# Patient Record
Sex: Female | Born: 1985 | Race: White | Hispanic: No | Marital: Single | Smoking: Never smoker
Health system: Southern US, Community
[De-identification: ages and names within clinical notes are randomized; demographics above are authoritative.]

---

## 2007-10-28 HISTORY — PX: WISDOM TOOTH EXTRACTION: SHX21

## 2017-02-06 ENCOUNTER — Encounter (HOSPITAL_COMMUNITY): Payer: Self-pay | Admitting: *Deleted

## 2017-02-06 ENCOUNTER — Emergency Department (HOSPITAL_COMMUNITY): Payer: BLUE CROSS/BLUE SHIELD

## 2017-02-06 ENCOUNTER — Emergency Department (HOSPITAL_COMMUNITY)
Admission: EM | Admit: 2017-02-06 | Discharge: 2017-02-07 | Disposition: A | Discharge status: Home / Self Care | Payer: BLUE CROSS/BLUE SHIELD | Attending: Physician Assistant | Admitting: Physician Assistant

## 2017-02-06 DIAGNOSIS — Y999 Unspecified external cause status: Secondary | ICD-10-CM | POA: Diagnosis not present

## 2017-02-06 DIAGNOSIS — S0990XA Unspecified injury of head, initial encounter: Secondary | ICD-10-CM | POA: Diagnosis not present

## 2017-02-06 DIAGNOSIS — Z79899 Other long term (current) drug therapy: Secondary | ICD-10-CM | POA: Diagnosis not present

## 2017-02-06 DIAGNOSIS — S60812A Abrasion of left wrist, initial encounter: Secondary | ICD-10-CM | POA: Diagnosis not present

## 2017-02-06 DIAGNOSIS — Y939 Activity, unspecified: Secondary | ICD-10-CM | POA: Insufficient documentation

## 2017-02-06 DIAGNOSIS — Z5181 Encounter for therapeutic drug level monitoring: Secondary | ICD-10-CM | POA: Insufficient documentation

## 2017-02-06 DIAGNOSIS — S0081XA Abrasion of other part of head, initial encounter: Secondary | ICD-10-CM | POA: Diagnosis not present

## 2017-02-06 DIAGNOSIS — S0993XA Unspecified injury of face, initial encounter: Secondary | ICD-10-CM | POA: Diagnosis present

## 2017-02-06 DIAGNOSIS — Z23 Encounter for immunization: Secondary | ICD-10-CM | POA: Insufficient documentation

## 2017-02-06 DIAGNOSIS — Y92410 Unspecified street and highway as the place of occurrence of the external cause: Secondary | ICD-10-CM | POA: Insufficient documentation

## 2017-02-06 LAB — COMPREHENSIVE METABOLIC PANEL
ALT: 27 U/L (ref 14–54)
AST: 34 U/L (ref 15–41)
Albumin: 4.3 g/dL (ref 3.5–5.0)
Alkaline Phosphatase: 47 U/L (ref 38–126)
Anion gap: 8 (ref 5–15)
BUN: 17 mg/dL (ref 6–20)
CO2: 23 mmol/L (ref 22–32)
CREATININE: 0.83 mg/dL (ref 0.44–1.00)
Calcium: 9.3 mg/dL (ref 8.9–10.3)
Chloride: 106 mmol/L (ref 101–111)
Glucose, Bld: 93 mg/dL (ref 65–99)
POTASSIUM: 3.7 mmol/L (ref 3.5–5.1)
Sodium: 137 mmol/L (ref 135–145)
TOTAL PROTEIN: 7.3 g/dL (ref 6.5–8.1)
Total Bilirubin: 0.5 mg/dL (ref 0.3–1.2)

## 2017-02-06 LAB — ETHANOL: Alcohol, Ethyl (B): <5 mg/dL (ref <5)

## 2017-02-06 LAB — I-STAT CG4 LACTIC ACID, ED: LACTIC ACID, VENOUS: 0.95 mmol/L (ref 0.5–1.9)

## 2017-02-06 LAB — I-STAT CHEM 8, ED
BUN: 22 mg/dL — ABNORMAL HIGH (ref 6–20)
CREATININE: 0.9 mg/dL (ref 0.44–1.00)
Calcium, Ion: 1.19 mmol/L (ref 1.15–1.40)
Chloride: 104 mmol/L (ref 101–111)
GLUCOSE: 96 mg/dL (ref 65–99)
HCT: 44 % (ref 36.0–46.0)
HEMOGLOBIN: 15 g/dL (ref 12.0–15.0)
Potassium: 3.7 mmol/L (ref 3.5–5.1)
Sodium: 139 mmol/L (ref 135–145)
TCO2: 26 mmol/L (ref 0–100)

## 2017-02-06 LAB — SAMPLE TO BLOOD BANK

## 2017-02-06 LAB — CBC
HCT: 42.8 % (ref 36.0–46.0)
Hemoglobin: 14.3 g/dL (ref 12.0–15.0)
MCH: 31.6 pg (ref 26.0–34.0)
MCHC: 33.4 g/dL (ref 30.0–36.0)
MCV: 94.5 fL (ref 78.0–100.0)
PLATELETS: 255 10*3/uL (ref 150–400)
RBC: 4.53 MIL/uL (ref 3.87–5.11)
RDW: 12.4 % (ref 11.5–15.5)
WBC: 10.3 10*3/uL (ref 4.0–10.5)

## 2017-02-06 LAB — PROTIME-INR
INR: 0.86
PROTHROMBIN TIME: 11.7 s (ref 11.4–15.2)

## 2017-02-06 LAB — I-STAT BETA HCG BLOOD, ED (MC, WL, AP ONLY)

## 2017-02-06 MED ORDER — TETANUS-DIPHTH-ACELL PERTUSSIS 5-2.5-18.5 LF-MCG/0.5 IM SUSP
0.5000 mL | Freq: Once | INTRAMUSCULAR | Status: AC
  Administered 2017-02-07: 0.5 mL via INTRAMUSCULAR
  Filled 2017-02-06: qty 0.5
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
## 2017-02-06 NOTE — ED Notes (Signed)
Pt in X ray/CT
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
## 2017-02-07 LAB — CDS SEROLOGY

## 2017-02-07 MED ORDER — CYCLOBENZAPRINE HCL 10 MG PO TABS: 10.0000 mg | ORAL_TABLET | Freq: Two times a day (BID) | ORAL | 0 refills | Status: DC | PRN

## 2017-02-07 MED ORDER — IBUPROFEN 800 MG PO TABS: 800.0000 mg | ORAL_TABLET | Freq: Three times a day (TID) | ORAL | 0 refills | Status: DC

## 2017-02-07 NOTE — Discharge Instructions (Signed)
Please use these medications to help with your symtpoms.  Return with any concerns.

## 2017-02-07 NOTE — ED Notes (Signed)
Dr Corlis Leak in room to discuss results with pt. C-collar removed by Dr Corlis Leak

## 2017-02-07 NOTE — ED Notes (Signed)
Pt verbalized understanding of d/c instructions and has no further questions. Pt stable and NAD. Pt able to ambulate without difficulty. VSS.
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
## 2019-08-22 ENCOUNTER — Other Ambulatory Visit: Payer: Self-pay

## 2019-08-22 DIAGNOSIS — Z20822 Contact with and (suspected) exposure to covid-19: Secondary | ICD-10-CM

## 2019-08-23 LAB — NOVEL CORONAVIRUS, NAA: SARS-CoV-2, NAA: NOT DETECTED

## 2022-10-27 NOTE — L&D Delivery Note (Signed)
Delivery Note  Ariana Mendez is a G2P0 at [redacted]w[redacted]d with an LMP of 11/09/2022, consistent with [place] at [redacted]w[redacted]d.   First Stage: Labor onset: on 10/17; ctx's 4-5 minutes apart for about 2 hours  Augmentation: oxytocin and AROM Analgesia /Anesthesia intrapartum: epidural AROM at 0053 GBS: negative IP Antibiotics: None  Second Stage: Complete dilation at 0713 Onset of pushing at 0742 FHR second stage 135 bpm with moderate, variable decels with pushing   [name] presented to L&D with uterine contractions every 2 to 5 minutes. She was augmented with AROM and IV Pitocin. She progressed  to C/C/+2 with no urge to push. She pushed effectively over approximately 25 minutes for a spontaneous vaginal birth.  Delivery of a viable baby boy on 08/14/2023 at 0820 by CNM Delivery of fetal head in OA position with restitution to LOT. Nuchal cord x 1 around shoulder;  Anterior then posterior shoulders delivered easily with gentle downward traction and baby somersaulted to maternal right. Baby placed on mom's chest, and attended to by baby RN. Cord double clamped after cessation of pulsation, cut by FOB  Margaretmary Eddy, CNM at bedside and supervised delivery.  Cord blood sample collection: Not Indicated A POS Performed at [facility]: [facility], [address] [place], [place] [address]  Collection of cord blood donation: None Arterial cord blood sample: None   Third Stage: Oxytocin bolus started after delivery of infant for hemorrhage prophylaxis  Placenta delivered intact with 3 VC @ 0829 Placenta disposition: Discarded  Uterine tone Firm / bleeding Minimal   Laceration identified: abrasion--> repair not indicated  Anesthesia for repair: None  Repair: None Est. Blood Loss (mL): 450  Complications: None   Mom to postpartum.  Baby to Couplet care / Skin to Skin.  Newborn: Information for the patient's newborn:  [name], [name] [[id number]]  Live born female  Birth Weight:   APGAR: 8,  9  Newborn Delivery   Birth date/time: 08/14/2023 08:20:00 Delivery type: Vaginal, Spontaneous      Feeding planned: breast feeding  ---------- Roney Jaffe CNM Certified Nurse Midwife [facility] OB[facility]

## 2023-01-01 DIAGNOSIS — O09513 Supervision of elderly primigravida, third trimester: Secondary | ICD-10-CM | POA: Insufficient documentation

## 2023-02-02 ENCOUNTER — Ambulatory Visit: Payer: Self-pay

## 2023-02-04 ENCOUNTER — Ambulatory Visit: Payer: Self-pay | Admitting: Maternal & Fetal Medicine

## 2023-02-04 ENCOUNTER — Ambulatory Visit: Payer: 59 | Attending: Maternal & Fetal Medicine

## 2023-02-04 ENCOUNTER — Other Ambulatory Visit: Payer: Self-pay

## 2023-02-04 VITALS — BP 121/91 | HR 99 | Temp 98.5°F | Ht 64.0 in | Wt 184.5 lb

## 2023-02-04 DIAGNOSIS — O09511 Supervision of elderly primigravida, first trimester: Secondary | ICD-10-CM | POA: Diagnosis not present

## 2023-02-04 DIAGNOSIS — Z3A12 12 weeks gestation of pregnancy: Secondary | ICD-10-CM

## 2023-02-04 DIAGNOSIS — Z818 Family history of other mental and behavioral disorders: Secondary | ICD-10-CM | POA: Diagnosis not present

## 2023-02-04 NOTE — Progress Notes (Signed)
Referring Provider:   [facility] Length of Consultation: 30 minutes  [name] was referred to Maternal Fetal Care at [facility] for genetic counseling because of advanced maternal age.  The patient will be 37 years old at the time of delivery.  She was present at this visit alone and was counseled by Sheppard Plumber, genetic counseling intern, supervised by Katrina Stack, MS, CGC.  We explained that the chance of a chromosome abnormality increases with maternal age.  Chromosomes and examples of chromosome problems were reviewed.  Humans typically have 46 chromosomes in each cell, with half passed through each sperm and egg.  Any change in the number or structure of chromosomes can increase the risk of problems in the physical and mental development of a pregnancy.   Based upon age of the patient and the current gestational age, the chance of any chromosome abnormality was 1 in 70. The chance of Down syndrome, the most common chromosome problem associated with maternal age, was 1 in 82.  The risk of chromosome problems is in addition to the 3% general population risk for birth defects and intellectual disabilities.  The greatest chance, of course, is that the baby would be born in good health.  We discussed the following prenatal screening and testing options for this pregnancy:  Cell free fetal DNA testing analyzes maternal blood to determine whether or not the baby may have Down syndrome, trisomy 35, or trisomy 68.  This test utilizes a maternal blood sample and DNA sequencing technology to isolate circulating cell free fetal DNA from maternal plasma.  The fetal DNA can then be analyzed for DNA sequences that are derived from the three most common chromosomes involved in aneuploidy, chromosomes 13, 18, and 21.  If the overall amount of DNA is greater than the expected level for any of these chromosomes, aneuploidy is suspected.  While we do not consider it a replacement for invasive  testing and karyotype analysis, a negative result from this testing would be reassuring, though not a guarantee of a normal chromosome complement for the baby.  An abnormal result is certainly suggestive of an abnormal chromosome complement, though we would still recommend CVS or amniocentesis to confirm any findings from this testing.  [name] had prior cell free DNA testing in this pregnancy which was negative for Trisomy 53, 59 and 21.  Results were also low risk for sex chromosome aneuploidies and revealed the fetal gender to be female. This result significantly reduces the chance for these conditions in the pregnancy, but is still considered a screening test.   The chorionic villus sampling procedure is available for first trimester chromosome analysis.  This involves the withdrawal of a small amount of chorionic villi (tissue from the developing placenta).  Risk of pregnancy loss is estimated to be approximately 1 in 200 to 1 in 100 (0.5 to 1%).  There is approximately a 1% (1 in 100) chance that the CVS chromosome results will be unclear.  Chorionic villi cannot be tested for neural tube defects.     Targeted ultrasound uses high frequency sound waves to create an image of the developing fetus.  An ultrasound is often recommended as a routine means of evaluating the pregnancy.  It is also used to screen for fetal anatomy problems (for example, a heart defect) that might be suggestive of a chromosomal or other abnormality.   Amniocentesis involves the removal of a small amount of amniotic fluid from the sac surrounding the fetus with  the use of a thin needle inserted through the maternal abdomen and uterus.  Ultrasound guidance is used throughout the procedure.  Fetal cells from amniotic fluid are directly evaluated and > 99.5% of chromosome problems and > 98% of open neural tube defects can be detected. This procedure is generally performed after the 15th week of pregnancy.  The main risks to this  procedure include complications leading to miscarriage in less than 1 in 200 cases (0.5%).  Carrier Screening: Per the ACOG Committee Opinion 691, all women who are considering a pregnancy or are currently pregnant should be offered carrier screening for, at minimum, Cystic Fibrosis (CF), Spinal Muscular Atrophy (SMA), and Hemoglobinopathies The mode of inheritance, clinical manifestations of these conditions, as well as details about testing were reviewed. A negative result on carrier screening reduces the likelihood of being a carrier, however, does not entirely rule out the possibility. If Ariana Mendez was found to be a carrier for a specific condition, carrier screening for their reproductive partner would be recommended.  We also discussed that expanded carrier screening (ECS) is a testing option available that evaluates for a wide range of genetic conditions. Some of these conditions are severe and actionable, but also rare; others occur more commonly, but are less severe. We discussed that testing panels could include from 14 conditions to more than 500 autosomal recessive or X-linked genetic conditions. In the event that one partner were found to be a carrier for one or more conditions, carrier screening would be available to the other partner for those conditions. This could help determine whether the current and future pregnancies are at increased risk for a particular recessive or X-linked genetic condition. We discussed the risks, benefits, and limitations of carrier screening. After thoughtful consideration of the options, Ariana Mendez elected to proceed with the Horizon 27 gene panel, which is considered a moderately sized pan ethnic panel.   Family history and pregnancy history: We obtained a detailed family history and pregnancy history.  [name] reported a family history of breast cancer in her paternal grandmother and two paternal aunts.  We reviewed that breast cancer can have strong inherited  components in some families and that genetic counseling with a cancer genetic counselor could be considered if desired.  She also reported that her mother has advanced Alzheimer's disease, which may also have genetic factors in some families, though the understand of those genetic components is not well understood.  Lastly, she has two paternal first cousins who have sons with autism. Autism Spectrum Disorder affects approximately 1-2% of the general population in the [place], [place], and [place]. Autism is a neurological and developmental disorder that affects how people interact with others, communicate, learn, and behave. Autism is known as a "spectrum" disorder because there is wide variation in the type and severity of symptoms people experience. Genetic testing for individuals with a clinical diagnosis of autism yields an explanation in only about 20% of cases, and the remaining 80% of cases are left with unknown etiology. In the absence of a known genetic cause in the affected family members, we are unable to test directly for autism in pregnancy. We did review the option of carrier testing for Fragile X syndrome, the most common inherited cause for intellectual delays which can have autism as a features. Given the fact that her father is unaffected, and Fragile X is an X linked condition, it is much less likely for this pregnancy to be at risk for that condition. We cannot comment  on risks for other causes of autism. Of note, FMR1 (the gene for Fragile X) is included in the Horizon 27 gene panel. The remainder of the family history is unremarkable for birth defects, developmental delays, recurrent pregnancy loss or known chromosome abnormalities.  [name] stated that this is her first pregnancy.  Her partner has a healthy 67 year old daughter.  .  She reported some bleeding in the first trimester but no exposures to medications, alcohol, tobacco or recreational drugs.  Plan of Care: Labs drawn  today for the Horizon 27 disease carrier screening panel.  Results will be communicated to the patient in approximately 2 weeks. Ariana Mendez declined invasive prenatal diagnosis at this time, but may consider this testing should concerns about the health of the pregnancy arise in other testing.  Termination of pregnancy would also be a consideration for this family if there were found to be a life limiting condition in the fetus. She stated that her fetal anatomy ultrasound will be performed at [facility].  If she/they desire her to have an ultrasound with Maternal Fetal Care, that can be scheduled. msAFP only to be drawn by OB if desired for spina bifida screening.  [name] was encouraged to call with questions or concerns.  We can be contacted at [phone].  Cherly Anderson, MS, CGC

## 2023-02-23 ENCOUNTER — Telehealth: Payer: Self-pay | Admitting: Obstetrics and Gynecology

## 2023-02-23 NOTE — Telephone Encounter (Signed)
PC to patient, left VM for pt to call back to review carrier screening results. Call back number [phone].  Cherly Anderson, MS, CGC

## 2023-02-23 NOTE — Telephone Encounter (Signed)
PC back from patient to review Horizon carrier screening results.  Ariana Mendez elected to have the Horizon 27 panel.  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 See report for details.  We reviewed the features and inheritance of ARPKD and the recommendation of testing her partner, Ariana Mendez for this condition. He has the option of the full panel or just this condition. The carrier frequency of ARPKD is approximately 1 in 100 for Caucasian persons, so the risk for this pregnancy to be affected with ARPKD is 1 in 400.  Testing through amniocentesis or following the pregnancy with ultrasound to evaluate for features of ARPKD (kidney manifestation, liver cysts) were reviewed.  The patient will speak with her partner and call me back this week with their desires. A more detailed note will follow with the plan of care at that time. We can be reached at [phone].  Cherly Anderson, MS, CGC

## 2023-03-05 ENCOUNTER — Telehealth: Payer: Self-pay | Admitting: Obstetrics and Gynecology

## 2023-03-05 NOTE — Telephone Encounter (Signed)
PC to Ms. Renfrow to follow up on the option of carrier screening for her husband, Tempie Hoist, for autosomal recessive polycystic kidney disease (ARPKD).  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  She would like [place] to have a saliva kit shipped to their home for him to have the Horizon 27 screening panel.  She was informed that results should be available about 3 weeks after the lab receives his sample.  We will request shipment of the kit today.  We can be reached at [phone].  Cherly Anderson, MS, CGC

## 2023-04-22 ENCOUNTER — Encounter: Payer: Self-pay | Admitting: Obstetrics and Gynecology

## 2023-08-13 ENCOUNTER — Inpatient Hospital Stay: Payer: 59 | Admitting: Anesthesiology

## 2023-08-13 ENCOUNTER — Other Ambulatory Visit: Payer: Self-pay

## 2023-08-13 ENCOUNTER — Inpatient Hospital Stay
Admission: EM | Admit: 2023-08-13 | Discharge: 2023-08-15 | DRG: 806 | Disposition: A | Discharge status: Home / Self Care | Payer: 59

## 2023-08-13 ENCOUNTER — Encounter: Payer: Self-pay | Admitting: Obstetrics and Gynecology

## 2023-08-13 DIAGNOSIS — O3413 Maternal care for benign tumor of corpus uteri, third trimester: Secondary | ICD-10-CM | POA: Diagnosis present

## 2023-08-13 DIAGNOSIS — Z8249 Family history of ischemic heart disease and other diseases of the circulatory system: Secondary | ICD-10-CM | POA: Diagnosis not present

## 2023-08-13 DIAGNOSIS — D259 Leiomyoma of uterus, unspecified: Secondary | ICD-10-CM | POA: Diagnosis present

## 2023-08-13 DIAGNOSIS — O26893 Other specified pregnancy related conditions, third trimester: Secondary | ICD-10-CM | POA: Diagnosis present

## 2023-08-13 DIAGNOSIS — O9081 Anemia of the puerperium: Secondary | ICD-10-CM | POA: Diagnosis not present

## 2023-08-13 DIAGNOSIS — Z3A39 39 weeks gestation of pregnancy: Secondary | ICD-10-CM | POA: Diagnosis not present

## 2023-08-13 DIAGNOSIS — Z7982 Long term (current) use of aspirin: Secondary | ICD-10-CM

## 2023-08-13 DIAGNOSIS — Z820 Family history of epilepsy and other diseases of the nervous system: Secondary | ICD-10-CM | POA: Diagnosis not present

## 2023-08-13 DIAGNOSIS — D62 Acute posthemorrhagic anemia: Secondary | ICD-10-CM | POA: Diagnosis not present

## 2023-08-13 DIAGNOSIS — O09523 Supervision of elderly multigravida, third trimester: Secondary | ICD-10-CM | POA: Diagnosis present

## 2023-08-13 LAB — CBC
HCT: 35.8 % — ABNORMAL LOW (ref 36.0–46.0)
Hemoglobin: 12.5 g/dL (ref 12.0–15.0)
MCH: 31.6 pg (ref 26.0–34.0)
MCHC: 34.9 g/dL (ref 30.0–36.0)
MCV: 90.4 fL (ref 80.0–100.0)
Platelets: 208 10*3/uL (ref 150–400)
RBC: 3.96 MIL/uL (ref 3.87–5.11)
RDW: 12.6 % (ref 11.5–15.5)
WBC: 9.3 10*3/uL (ref 4.0–10.5)
nRBC: 0 % (ref 0.0–0.2)

## 2023-08-13 LAB — TYPE AND SCREEN
ABO/RH(D): A POS
Antibody Screen: NEGATIVE

## 2023-08-13 MED ORDER — FENTANYL-BUPIVACAINE-NACL 0.5-0.125-0.9 MG/250ML-% EP SOLN
EPIDURAL | Status: AC
  Filled 2023-08-13: qty 250

## 2023-08-13 MED ORDER — LIDOCAINE HCL (PF) 1 % IJ SOLN
30.0000 mL | INTRAMUSCULAR | Status: DC | PRN
  Filled 2023-08-13: qty 30

## 2023-08-13 MED ORDER — SODIUM CHLORIDE 0.9 % IV SOLN
INTRAVENOUS | Status: DC | PRN
  Administered 2023-08-13: 8 mL via EPIDURAL

## 2023-08-13 MED ORDER — FENTANYL-BUPIVACAINE-NACL 0.5-0.125-0.9 MG/250ML-% EP SOLN
12.0000 mL/h | EPIDURAL | Status: DC | PRN
  Administered 2023-08-13: 12 mL/h via EPIDURAL

## 2023-08-13 MED ORDER — LIDOCAINE-EPINEPHRINE (PF) 1.5 %-1:200000 IJ SOLN
INTRAMUSCULAR | Status: DC | PRN
  Administered 2023-08-13: 3 mL via EPIDURAL

## 2023-08-13 MED ORDER — TERBUTALINE SULFATE 1 MG/ML IJ SOLN: 0.2500 mg | Freq: Once | INTRAMUSCULAR | Status: DC | PRN

## 2023-08-13 MED ORDER — PHENYLEPHRINE 80 MCG/ML (10ML) SYRINGE FOR IV PUSH (FOR BLOOD PRESSURE SUPPORT): 80.0000 ug | PREFILLED_SYRINGE | INTRAVENOUS | Status: DC | PRN

## 2023-08-13 MED ORDER — LACTATED RINGERS IV SOLN: 500.0000 mL | Freq: Once | INTRAVENOUS | Status: DC

## 2023-08-13 MED ORDER — DIPHENHYDRAMINE HCL 50 MG/ML IJ SOLN: 12.5000 mg | INTRAMUSCULAR | Status: DC | PRN

## 2023-08-13 MED ORDER — LIDOCAINE HCL (PF) 1 % IJ SOLN
INTRAMUSCULAR | Status: DC | PRN
  Administered 2023-08-13: 3 mL

## 2023-08-13 MED ORDER — OXYTOCIN BOLUS FROM INFUSION
333.0000 mL | Freq: Once | INTRAVENOUS | Status: AC
  Administered 2023-08-14: 333 mL via INTRAVENOUS

## 2023-08-13 MED ORDER — ACETAMINOPHEN 325 MG PO TABS: 650.0000 mg | ORAL_TABLET | ORAL | Status: DC | PRN

## 2023-08-13 MED ORDER — LACTATED RINGERS IV SOLN: INTRAVENOUS | Status: DC

## 2023-08-13 MED ORDER — LACTATED RINGERS IV SOLN: 500.0000 mL | INTRAVENOUS | Status: DC | PRN

## 2023-08-13 MED ORDER — OXYTOCIN-SODIUM CHLORIDE 30-0.9 UT/500ML-% IV SOLN
1.0000 m[IU]/min | INTRAVENOUS | Status: DC
  Administered 2023-08-14: 2 m[IU]/min via INTRAVENOUS
  Filled 2023-08-13: qty 500

## 2023-08-13 MED ORDER — SOD CITRATE-CITRIC ACID 500-334 MG/5ML PO SOLN: 30.0000 mL | ORAL | Status: DC | PRN

## 2023-08-13 MED ORDER — ONDANSETRON HCL 4 MG/2ML IJ SOLN
4.0000 mg | Freq: Four times a day (QID) | INTRAMUSCULAR | Status: DC | PRN
  Administered 2023-08-14: 4 mg via INTRAVENOUS
  Filled 2023-08-13: qty 2

## 2023-08-13 MED ORDER — EPHEDRINE 5 MG/ML INJ: 10.0000 mg | INTRAVENOUS | Status: DC | PRN

## 2023-08-13 MED ORDER — OXYTOCIN-SODIUM CHLORIDE 30-0.9 UT/500ML-% IV SOLN: 2.5000 [IU]/h | INTRAVENOUS | Status: DC

## 2023-08-13 NOTE — Anesthesia Procedure Notes (Signed)
Epidural Patient location during procedure: OB  Staffing Anesthesiologist: Corinda Gubler, MD Performed: anesthesiologist   Preanesthetic Checklist Completed: patient identified, IV checked, site marked, risks and benefits discussed, surgical consent, monitors and equipment checked, pre-op evaluation and timeout performed  Epidural Patient position: sitting Prep: ChloraPrep Patient monitoring: heart rate, continuous pulse ox and blood pressure Approach: midline Location: L3-L4 Injection technique: LOR saline  Needle:  Needle type: Tuohy  Needle gauge: 17 G Needle length: 9 cm Needle insertion depth: 5 cm Catheter type: closed end flexible Catheter size: 19 Gauge Catheter at skin depth: 10 cm Test dose: negative and 1.5% lidocaine with Epi 1:200 K  Assessment Sensory level: T10 Events: blood not aspirated, no cerebrospinal fluid, injection not painful, no injection resistance, no paresthesia and negative IV test  Additional Notes First/one attempt Pt. Evaluated and documentation done after procedure finished. Patient identified. Risks/Benefits/Options discussed with patient including but not limited to bleeding, infection, nerve damage, paralysis, failed block, incomplete pain control, headache, blood pressure changes, nausea, vomiting, reactions to medication both or allergic, itching and postpartum back pain. Confirmed with bedside nurse the patient's most recent platelet count. Confirmed with patient that they are not currently taking any anticoagulation, have any bleeding history or any family history of bleeding disorders. Patient expressed understanding and wished to proceed. All questions were answered. Sterile technique was used throughout the entire procedure. Please see nursing notes for vital signs. Test dose was given through epidural catheter and negative prior to continuing to dose epidural or start infusion. Warning signs of high block given to the patient including  shortness of breath, tingling/numbness in hands, complete motor block, or any concerning symptoms with instructions to call for help. Patient was given instructions on fall risk and not to get out of bed. All questions and concerns addressed with instructions to call with any issues or inadequate analgesia.     Patient tolerated the insertion well without immediate complications.  Reason for block: procedure for painReason for block:procedure for pain

## 2023-08-13 NOTE — Anesthesia Preprocedure Evaluation (Signed)
Anesthesia Evaluation  Patient identified by MRN, date of birth, ID band Patient awake    Reviewed: Allergy & Precautions, NPO status , Patient's Chart, lab work & pertinent test results  History of Anesthesia Complications Negative for: history of anesthetic complications  Airway Mallampati: III  TM Distance: >3 FB Neck ROM: Full    Dental no notable dental hx. (+) Teeth Intact   Pulmonary neg pulmonary ROS, neg sleep apnea, neg COPD, Patient abstained from smoking.Not current smoker   Pulmonary exam normal breath sounds clear to auscultation       Cardiovascular Exercise Tolerance: Good METS(-) hypertension(-) CAD and (-) Past MI negative cardio ROS (-) dysrhythmias  Rhythm:Regular Rate:Normal - Systolic murmurs    Neuro/Psych negative neurological ROS  negative psych ROS   GI/Hepatic ,neg GERD  ,,(+)     (-) substance abuse    Endo/Other  neg diabetes    Renal/GU negative Renal ROS     Musculoskeletal   Abdominal   Peds  Hematology   Anesthesia Other Findings History reviewed. No pertinent past medical history.  Reproductive/Obstetrics (+) Pregnancy                             Anesthesia Physical Anesthesia Plan  ASA: 2  Anesthesia Plan: Epidural   Post-op Pain Management:    Induction:   PONV Risk Score and Plan: 2 and Treatment may vary due to age or medical condition and Ondansetron  Airway Management Planned: Natural Airway  Additional Equipment:   Intra-op Plan:   Post-operative Plan:   Informed Consent: I have reviewed the patients History and Physical, chart, labs and discussed the procedure including the risks, benefits and alternatives for the proposed anesthesia with the patient or authorized representative who has indicated his/her understanding and acceptance.       Plan Discussed with: Surgeon  Anesthesia Plan Comments: (Discussed R/B/A of neuraxial  anesthesia technique with patient: - rare risks of spinal/epidural hematoma, nerve damage, infection - Risk of PDPH - Risk of itching - Risk of nausea and vomiting - Risk of poor block necessitating replacement of epidural. - Risk of allergic reactions. Patient voiced understanding.)       Anesthesia Quick Evaluation

## 2023-08-13 NOTE — OB Triage Note (Signed)
Pt arrives G2P0 with c/o ctx's x 4-5 minutes apart for about two hours. Pt denies bleeding or LOF.

## 2023-08-14 ENCOUNTER — Encounter: Payer: Self-pay | Admitting: Obstetrics and Gynecology

## 2023-08-14 DIAGNOSIS — D259 Leiomyoma of uterus, unspecified: Secondary | ICD-10-CM | POA: Diagnosis present

## 2023-08-14 DIAGNOSIS — O09523 Supervision of elderly multigravida, third trimester: Secondary | ICD-10-CM | POA: Diagnosis present

## 2023-08-14 LAB — RPR: RPR Ser Ql: NONREACTIVE

## 2023-08-14 LAB — ABO/RH: ABO/RH(D): A POS

## 2023-08-14 MED ORDER — MAGNESIUM OXIDE -MG SUPPLEMENT 400 (240 MG) MG PO TABS
200.0000 mg | ORAL_TABLET | Freq: Every day | ORAL | Status: DC
  Administered 2023-08-14 – 2023-08-15 (×2): 200 mg via ORAL
  Filled 2023-08-14 (×2): qty 0.5

## 2023-08-14 MED ORDER — FERROUS SULFATE 325 (65 FE) MG PO TABS
325.0000 mg | ORAL_TABLET | Freq: Two times a day (BID) | ORAL | Status: DC
  Administered 2023-08-14 – 2023-08-15 (×2): 325 mg via ORAL
  Filled 2023-08-14 (×2): qty 1

## 2023-08-14 MED ORDER — DIPHENHYDRAMINE HCL 25 MG PO CAPS: 25.0000 mg | ORAL_CAPSULE | Freq: Four times a day (QID) | ORAL | Status: DC | PRN

## 2023-08-14 MED ORDER — MISOPROSTOL 200 MCG PO TABS
ORAL_TABLET | ORAL | Status: AC
  Filled 2023-08-14: qty 4

## 2023-08-14 MED ORDER — LACTATED RINGERS IV BOLUS
500.0000 mL | Freq: Once | INTRAVENOUS | Status: AC
  Administered 2023-08-13: 500 mL via INTRAVENOUS

## 2023-08-14 MED ORDER — SODIUM CHLORIDE 0.9% FLUSH: 3.0000 mL | INTRAVENOUS | Status: DC | PRN

## 2023-08-14 MED ORDER — DIBUCAINE (PERIANAL) 1 % EX OINT
1.0000 | TOPICAL_OINTMENT | CUTANEOUS | Status: DC | PRN
  Filled 2023-08-14 (×2): qty 28

## 2023-08-14 MED ORDER — ZOLPIDEM TARTRATE 5 MG PO TABS: 5.0000 mg | ORAL_TABLET | Freq: Every evening | ORAL | Status: DC | PRN

## 2023-08-14 MED ORDER — SIMETHICONE 80 MG PO CHEW: 80.0000 mg | CHEWABLE_TABLET | ORAL | Status: DC | PRN

## 2023-08-14 MED ORDER — IBUPROFEN 600 MG PO TABS
600.0000 mg | ORAL_TABLET | Freq: Four times a day (QID) | ORAL | Status: DC
  Administered 2023-08-14 – 2023-08-15 (×5): 600 mg via ORAL
  Filled 2023-08-14 (×7): qty 1

## 2023-08-14 MED ORDER — SENNOSIDES-DOCUSATE SODIUM 8.6-50 MG PO TABS
2.0000 | ORAL_TABLET | Freq: Every day | ORAL | Status: DC
  Administered 2023-08-15: 2 via ORAL
  Filled 2023-08-14: qty 2

## 2023-08-14 MED ORDER — ONDANSETRON HCL 4 MG PO TABS: 4.0000 mg | ORAL_TABLET | ORAL | Status: DC | PRN

## 2023-08-14 MED ORDER — BENZOCAINE-MENTHOL 20-0.5 % EX AERO
1.0000 | INHALATION_SPRAY | CUTANEOUS | Status: DC | PRN
  Filled 2023-08-14: qty 56

## 2023-08-14 MED ORDER — SODIUM CHLORIDE 0.9% FLUSH: 10.0000 mL | Freq: Two times a day (BID) | INTRAVENOUS | Status: DC

## 2023-08-14 MED ORDER — AMMONIA AROMATIC IN INHA
RESPIRATORY_TRACT | Status: AC
  Filled 2023-08-14: qty 10

## 2023-08-14 MED ORDER — SODIUM CHLORIDE 0.9% FLUSH: 3.0000 mL | Freq: Two times a day (BID) | INTRAVENOUS | Status: DC

## 2023-08-14 MED ORDER — COCONUT OIL OIL
1.0000 | TOPICAL_OIL | Status: DC | PRN
  Administered 2023-08-15: 1 via TOPICAL
  Filled 2023-08-14: qty 7.5
  Filled 2023-08-14: qty 15

## 2023-08-14 MED ORDER — OXYTOCIN 10 UNIT/ML IJ SOLN
INTRAMUSCULAR | Status: AC
  Filled 2023-08-14: qty 1

## 2023-08-14 MED ORDER — ACETAMINOPHEN 500 MG PO TABS
1000.0000 mg | ORAL_TABLET | Freq: Four times a day (QID) | ORAL | Status: DC
  Administered 2023-08-14 – 2023-08-15 (×5): 1000 mg via ORAL
  Filled 2023-08-14 (×6): qty 2

## 2023-08-14 MED ORDER — WITCH HAZEL-GLYCERIN EX PADS
1.0000 | MEDICATED_PAD | CUTANEOUS | Status: DC | PRN
  Filled 2023-08-14 (×2): qty 100

## 2023-08-14 MED ORDER — PRENATAL MULTIVITAMIN CH
1.0000 | ORAL_TABLET | Freq: Every day | ORAL | Status: DC
  Administered 2023-08-14 – 2023-08-15 (×2): 1 via ORAL
  Filled 2023-08-14 (×2): qty 1

## 2023-08-14 MED ORDER — ONDANSETRON HCL 4 MG/2ML IJ SOLN: 4.0000 mg | INTRAMUSCULAR | Status: DC | PRN

## 2023-08-14 MED ORDER — VITAMIN D 25 MCG (1000 UNIT) PO TABS
5000.0000 [IU] | ORAL_TABLET | Freq: Every day | ORAL | Status: DC
  Administered 2023-08-14 – 2023-08-15 (×2): 5000 [IU] via ORAL
  Filled 2023-08-14 (×2): qty 5

## 2023-08-14 NOTE — H&P (Signed)
OB History & Physical   History of Present Illness:   Chief Complaint: uterine contractions  HPI:  Ariana Mendez is a 37 y.o. G2P0 female at [redacted]w[redacted]d, Patient's last menstrual period was 11/09/2022., consistent with [place] at [redacted]w[redacted]d, with Estimated Date of Delivery: 08/16/23.  She presents to L&D for uterine contractions  Reports active fetal movement  Contractions: every 2 to 5 minutes LOF/SROM: intact Vaginal bleeding: denies  Factors complicating pregnancy:  AMA Fibroids White coat syndrome  Patient Active Problem List   Diagnosis Date Noted   Normal labor and delivery 08/13/2023    Prenatal Transfer Tool  Maternal Diabetes: No Genetic Screening: Normal Maternal Ultrasounds/Referrals: Normal Fetal Ultrasounds or other Referrals:  None Maternal Substance Abuse:  No Significant Maternal Medications:  None Significant Maternal Lab Results: Group B Strep negative  Maternal Medical History:  History reviewed. No pertinent past medical history.  Past Surgical History:  Procedure Laterality Date   WISDOM TOOTH EXTRACTION  10/28/2007   all 4    No Known Allergies  Prior to Admission medications   Medication Sig Start Date End Date Taking? Authorizing Provider  aspirin 81 MG chewable tablet Chew 81 mg by mouth daily.    [provider]  cholecalciferol (VITAMIN D3) 25 MCG (1000 UNIT) tablet Take 5,000 Units by mouth daily.    [provider]  magnesium oxide (MAG-OX) 400 (240 Mg) MG tablet Take 200 mg by mouth daily.    [provider]  Prenatal Vit-Fe Fumarate-FA (PRENATAL MULTIVITAMIN) TABS tablet Take 1 tablet by mouth daily at 12 noon.    [provider]     Prenatal care site:  [facility]  OB History  Gravida Para Term Preterm AB Living  2 0 0 0 0 0  SAB IAB Ectopic Multiple Live Births  0 0 0 0 0    # Outcome Date GA Lbr Len/2nd Weight Sex Type Anes PTL Lv  2 Current           1 Gravida              Social  History: She  reports that she has never smoked. She has never used smokeless tobacco. She reports that she does not currently use alcohol. She reports that she does not use drugs.  Family History: family history includes Alzheimer's disease in her mother; Cancer in her paternal grandmother; Hyperlipidemia in her father; Hypertension in her mother.   Review of Systems: A full review of systems was performed and negative except as noted in the HPI.     Physical Exam:  Vital Signs: BP 115/85   Pulse 79   Temp 97.9 F (36.6 C) (Oral)   Resp 18   Ht 5\' 4"  (1.626 m)   Wt 87.1 kg   LMP 11/09/2022   SpO2 98%   BMI 32.96 kg/m   General: no acute distress.  HEENT: normocephalic, atraumatic Heart: regular rate & rhythm Lungs: normal respiratory effort Abdomen: soft, gravid, non-tender;  EFW: 7.14 lbs Pelvic:   External: Normal external female genitalia  Cervix: Dilation: 3 / Effacement (%): 70 / Station: -2    Extremities: non-tender, symmetric, no edema bilaterally.  DTRs: +2  Neurologic: Alert & oriented x 3.    Results for orders placed or performed during the hospital encounter of 08/13/23 (from the past 24 hour(s))  CBC     Status: Abnormal   Collection Time: 08/13/23 10:00 PM  Result Value Ref Range   WBC 9.3 4.0 -  10.5 K/uL   RBC 3.96 3.87 - 5.11 MIL/uL   Hemoglobin 12.5 12.0 - 15.0 g/dL   HCT 78.4 (L) 69.6 - 29.5 %   MCV 90.4 80.0 - 100.0 fL   MCH 31.6 26.0 - 34.0 pg   MCHC 34.9 30.0 - 36.0 g/dL   RDW 28.4 13.2 - 44.0 %   Platelets 208 150 - 400 K/uL   nRBC 0.0 0.0 - 0.2 %  Type and screen [facility]     Status: None   Collection Time: 08/13/23 10:00 PM  Result Value Ref Range   ABO/RH(D) A POS    Antibody Screen NEG    Sample Expiration      08/16/2023,2359 Performed at [facility], [address] [place], [place] [address]   ABO/Rh     Status: None   Collection Time: 08/14/23 12:03 AM  Result Value Ref Range   ABO/RH(D)       A POS Performed at [facility], [address], [place], [place] [address]     Pertinent Results:  Prenatal Labs: Blood type/Rh A POS Performed at [facility], [address] [place], [place] [address]    Antibody screen Negative    Rubella immune    Varicella Immune  RPR NR    HBsAg Neg   Hep C NR   HIV NR    GC neg  Chlamydia neg  Genetic screening cfDNA negative   1 hour GTT 113  3 hour GTT N/A  GBS Neg     FHT:  FHR: 135 bpm, variability: moderate,  accelerations:  Present,  decelerations:  Absent Category/reactivity:  Category I UC:   regular, every 2-5 minutes   Cephalic by Leopolds and SVE   No results found.  Assessment:  Ariana Mendez is a 37 y.o. G2P0 female at [redacted]w[redacted]d with normal labor.   Plan:  1. Admit to Labor & Delivery - consents reviewed and obtained - .Dr Virgel Manifold MD notified of admission and plan of care   2. Fetal Well being  - Fetal Tracing: category 1 - Group B Streptococcus ppx not indicated: GBS negative - Presentation: cephalic confirmed by SVE   3. Routine OB: - Prenatal labs reviewed, as above - Rh positive - CBC, T&S, RPR on admit - Clear liquid diet , continuous IV fluids  4. Monitoring of labor  - Contractions monitored with external toco - Pelvis adequate for trial of labor  - Plan for expectant management  - Augmentation with oxytocin and AROM as appropriate  - Plan for  continuous fetal monitoring - Maternal pain control as desired; planning regional anesthesia - Anticipate vaginal delivery  active labor  5. Post Partum Planning: - Infant feeding: breast feeding - Contraception: vasectomy, IUD - Tdap vaccine: Given prenatally - Flu vaccine:  declined -RSV vaccine:declined  Chari Manning, CNM 08/14/23 12:40 AM  Chari Manning, CNM Certified Nurse Midwife [facility]

## 2023-08-14 NOTE — Plan of Care (Signed)
CHL Tonsillectomy/Adenoidectomy, Postoperative PEDS care plan entered in error.

## 2023-08-14 NOTE — Progress Notes (Signed)
Pt declined augmentation of pitocin. Continuing to labor naturally and rest. VSS. Monitors applied and assessing. Zykeria Laguardia B. Kimo Bancroft, RN BSN 08/14/2023 1:14 AM

## 2023-08-14 NOTE — Progress Notes (Signed)
L&D Note    Subjective:  has no unusual complaints  Objective:   Vitals:   08/14/23 0320 08/14/23 0325 08/14/23 0330 08/14/23 0335  BP:      Pulse:      Resp:      Temp:      TempSrc:      SpO2: 95% 95% 95% 95%  Weight:      Height:        Current Vital Signs 24h Vital Sign Ranges  T 97.9 F (36.6 C) Temp  Avg: 97.9 F (36.6 C)  Min: 97.9 F (36.6 C)  Max: 97.9 F (36.6 C)  BP (!) 140/88 BP  Min: 115/85  Max: 140/88  HR 79 Pulse  Avg: 80.2  Min: 72  Max: 94  RR 18 Resp  Avg: 18  Min: 18  Max: 18  SaO2 95 %   SpO2  Avg: 96.8 %  Min: 95 %  Max: 100 %      Gen: alert, cooperative, no distress FHR: Baseline: 140 bpm, Variability: moderate, Accels: Present, Decels: none Toco: regular, every 3-4 minutes SVE: Dilation: 6 Effacement (%): 90 Cervical Position: Middle Station: -1 Presentation: Vertex Exam by:: Rubye Oaks, CNM  Medications SCHEDULED MEDICATIONS   ammonia       misoprostol       oxytocin       oxytocin 40 units in LR 1000 mL  333 mL Intravenous Once    MEDICATION INFUSIONS   fentaNYL 2 mcg/mL w/bupivacaine 0.125% in NS 250 mL 12 mL/hr (08/13/23 2328)   lactated ringers     lactated ringers     lactated ringers 125 mL/hr at 08/13/23 2347   oxytocin     oxytocin      PRN MEDICATIONS  acetaminophen, ammonia, diphenhydrAMINE, ePHEDrine, ePHEDrine, fentaNYL 2 mcg/mL w/bupivacaine 0.125% in NS 250 mL, lactated ringers, lidocaine (PF), misoprostol, ondansetron, oxytocin, phenylephrine, phenylephrine, sodium citrate-citric acid, terbutaline   Assessment & Plan:  37 y.o. G2P0 at [redacted]w[redacted]d admitted for labor -Labor: Active phase labor. and Inadequate uterine activity - intensity or frequency. -Fetal Well-being: Category I -GBS: negative -Membranes ruptured, meconium light -Anticipate vaginal delivery. and Intervention: IV Pitocin augmentation and change maternal position -Analgesia: regional anesthesia   Chari Manning, CNM  08/14/2023 3:49 AM  [facility]

## 2023-08-15 LAB — CBC
HCT: 28.6 % — ABNORMAL LOW (ref 36.0–46.0)
Hemoglobin: 10 g/dL — ABNORMAL LOW (ref 12.0–15.0)
MCH: 31.9 pg (ref 26.0–34.0)
MCHC: 35 g/dL (ref 30.0–36.0)
MCV: 91.4 fL (ref 80.0–100.0)
Platelets: 219 10*3/uL (ref 150–400)
RBC: 3.13 MIL/uL — ABNORMAL LOW (ref 3.87–5.11)
RDW: 13 % (ref 11.5–15.5)
WBC: 11.5 10*3/uL — ABNORMAL HIGH (ref 4.0–10.5)
nRBC: 0 % (ref 0.0–0.2)

## 2023-08-15 MED ORDER — COCONUT OIL OIL: 1.0000 | TOPICAL_OIL | Status: AC | PRN

## 2023-08-15 MED ORDER — WITCH HAZEL-GLYCERIN EX PADS: 1.0000 | MEDICATED_PAD | CUTANEOUS | Status: AC | PRN

## 2023-08-15 MED ORDER — ACETAMINOPHEN 500 MG PO TABS: 1000.0000 mg | ORAL_TABLET | Freq: Four times a day (QID) | ORAL | Status: AC

## 2023-08-15 MED ORDER — SIMETHICONE 80 MG PO CHEW: 80.0000 mg | CHEWABLE_TABLET | ORAL | Status: AC | PRN

## 2023-08-15 MED ORDER — BENZOCAINE-MENTHOL 20-0.5 % EX AERO: 1.0000 | INHALATION_SPRAY | CUTANEOUS | Status: AC | PRN

## 2023-08-15 MED ORDER — SENNOSIDES-DOCUSATE SODIUM 8.6-50 MG PO TABS: 2.0000 | ORAL_TABLET | Freq: Every day | ORAL | Status: AC

## 2023-08-15 MED ORDER — DIBUCAINE (PERIANAL) 1 % EX OINT: 1.0000 | TOPICAL_OINTMENT | CUTANEOUS | Status: AC | PRN

## 2023-08-15 MED ORDER — FERROUS SULFATE 325 (65 FE) MG PO TABS: 325.0000 mg | ORAL_TABLET | Freq: Two times a day (BID) | ORAL | Status: AC

## 2023-08-15 MED ORDER — IBUPROFEN 600 MG PO TABS: 600.0000 mg | ORAL_TABLET | Freq: Four times a day (QID) | ORAL | 0 refills | Status: AC

## 2023-08-15 NOTE — Anesthesia Post-op Follow-up Note (Signed)
  Anesthesia Pain Follow-up Note  Patient: Ariana Mendez  Day #: 1  Date of Follow-up: 08/15/2023 Time: 9:05 AM  Last Vitals:  Vitals:   08/15/23 0300 08/15/23 0854  BP: 100/67 (!) 98/58  Pulse: 81 76  Resp: 20 18  Temp: 36.9 C 37 C  SpO2: 99% 98%    Level of Consciousness: alert  Pain: none   Side Effects:None  Catheter Site Exam:clean, dry, no drainage     Plan: D/C from anesthesia care at surgeon's request  Stephanie Coup

## 2023-08-15 NOTE — Progress Notes (Signed)
Discharge instructions reviewed with patient and significant other.  Questions answered and follow up care reviewed.  Printed copies given to patient for reference after discharge home.

## 2023-08-15 NOTE — Discharge Summary (Signed)
Postpartum Discharge Summary  Patient Name: Ariana Mendez DOB: 04-26-1986 MRN: [id number]  Date of admission: 08/13/2023 Delivery date:08/14/2023 Delivering provider: Roney Jaffe Date of discharge: 08/15/2023  Primary OB: [facility] [id number] last menstrual period was 11/09/2022. EDC Estimated Date of Delivery: 08/16/23 Gestational Age at Delivery: [redacted]w[redacted]d   Admitting diagnosis: Normal labor and delivery [O80] Intrauterine pregnancy: [redacted]w[redacted]d     Secondary diagnosis:   Principal Problem:   Normal labor and delivery Active Problems:   AMA (advanced maternal age) multigravida 35+, third trimester   Uterine fibroid during pregnancy, antepartum   NSVD (normal spontaneous vaginal delivery)   Discharge Diagnosis: Term Pregnancy Delivered      Hospital course: [place] of Labor With Vaginal Delivery      37 y.o. yo G2P1001 at [redacted]w[redacted]d was admitted in Latent Labor on 08/13/2023. Labor course was uncomplicated  Membrane Rupture Time/Date: 12:53 AM,08/14/2023  Delivery Method:Vaginal, Spontaneous Operative Delivery:N/A Episiotomy: None Lacerations:  None Patient had an uncomplicated postpartum course. She is ambulating, tolerating a regular diet, passing flatus, and urinating well. Patient is discharged home in stable condition on 08/15/23.  Newborn Data: Birth date:08/14/2023"Liam" Birth time:8:20 AM Gender:Female Living status:Living Apgars:8 ,9  Weight:3250 g                                            Post partum procedures: none Augmentation:: AROM and Pitocin Complications: None Delivery Type: spontaneous vaginal delivery Anesthesia: epidural anesthesia Placenta: spontaneous To Pathology: No   Prenatal Labs:   Blood type/Rh A POS Performed at [facility], [address] [place], [place] [address]    Antibody screen Negative    Rubella immune    Varicella Immune  RPR NR    HBsAg Neg   Hep C NR   HIV NR    GC neg  Chlamydia neg  Genetic  screening cfDNA negative   1 hour GTT 113  3 hour GTT N/A  GBS Neg       Magnesium Sulfate received: No BMZ received: No Rhophylac:was not indicated MMR: was not indicated Varivax vaccine given: was not indicated T-DaP:Given prenatally Flu:  declined  Transfusion:No  Physical exam  Vitals:   08/14/23 1753 08/14/23 1941 08/15/23 0300 08/15/23 0854  BP: (!) 125/55 119/82 100/67 (!) 98/58  Pulse: 76 77 81 76  Resp: 16 18 20 18   Temp: 98.1 F (36.7 C) 98.4 F (36.9 C) 98.4 F (36.9 C) 98.6 F (37 C)  TempSrc: Oral Oral Oral Oral  SpO2:  99% 99% 98%  Weight:      Height:       General: alert, cooperative, and no distress Lochia: appropriate Uterine Fundus: firm Perineum:minimal edema/intact, laceration hemostatic DVT Evaluation: No evidence of DVT seen on physical exam. Negative Homan's sign. No cords or calf tenderness. No significant calf/ankle edema.  Labs: Lab Results  Component Value Date   WBC 11.5 (H) 08/15/2023   HGB 10.0 (L) 08/15/2023   HCT 28.6 (L) 08/15/2023   MCV 91.4 08/15/2023   PLT 219 08/15/2023      Latest Ref Rng & Units 02/06/2017    9:56 PM  CMP  Glucose 65 - 99 mg/dL 96   BUN 6 - 20 mg/dL 22   Creatinine 1.30 - 1.00 mg/dL 8.65   Sodium 784 - 696 mmol/L 139   Potassium 3.5 - 5.1 mmol/L 3.7   Chloride  101 - 111 mmol/L 104    Edinburgh Score:    08/14/2023    7:50 PM  Edinburgh Postnatal Depression Scale Screening Tool  I have been able to laugh and see the funny side of things. 0  I have looked forward with enjoyment to things. 0  I have blamed myself unnecessarily when things went wrong. 0  I have been anxious or worried for no good reason. 0  I have felt scared or panicky for no good reason. 0  Things have been getting on top of me. 0  I have been so unhappy that I have had difficulty sleeping. 0  I have felt sad or miserable. 0  I have been so unhappy that I have been crying. 0  The thought of harming myself has occurred to  me. 0  Edinburgh Postnatal Depression Scale Total 0    Risk assessment for postpartum VTE and prophylactic treatment: Very high risk factors: None High risk factors: None Moderate risk factors: BMI 30-40 kg/m2  Postpartum VTE prophylaxis with LMWH not indicated  After visit meds:  Allergies as of 08/15/2023   No Known Allergies      Medication List     TAKE these medications    acetaminophen 500 MG tablet Commonly known as: TYLENOL Take 2 tablets (1,000 mg total) by mouth every 6 (six) hours.   benzocaine-Menthol 20-0.5 % Aero Commonly known as: DERMOPLAST Apply 1 Application topically as needed for irritation (perineal discomfort).   cholecalciferol 25 MCG (1000 UNIT) tablet Commonly known as: VITAMIN D3 Take 5,000 Units by mouth daily.   coconut oil Oil Apply 1 Application topically as needed.   dibucaine 1 % Oint Commonly known as: NUPERCAINAL Place 1 Application rectally as needed for hemorrhoids.   ferrous sulfate 325 (65 FE) MG tablet Take 1 tablet (325 mg total) by mouth 2 (two) times daily with a meal.   ibuprofen 600 MG tablet Commonly known as: ADVIL Take 1 tablet (600 mg total) by mouth every 6 (six) hours.   magnesium oxide 400 (240 Mg) MG tablet Commonly known as: MAG-OX Take 200 mg by mouth daily.   prenatal multivitamin Tabs tablet Take 1 tablet by mouth daily at 12 noon.   senna-docusate 8.6-50 MG tablet Commonly known as: Senokot-S Take 2 tablets by mouth daily. Start taking on: August 16, 2023   simethicone 80 MG chewable tablet Commonly known as: MYLICON Chew 1 tablet (80 mg total) by mouth as needed for flatulence.   witch hazel-glycerin pad Commonly known as: TUCKS Apply 1 Application topically as needed for hemorrhoids.       Discharge home in stable condition Infant Feeding: Bottle and supplementation with formula Infant Disposition:home with mother Discharge instruction: per After Visit Summary and Postpartum  booklet. Activity: Advance as tolerated. Pelvic rest for 6 weeks.  Diet: routine diet Anticipated Birth Control: IUD Postpartum Appointment:6 weeks Additional Postpartum F/U:  none Future Appointments:No future appointments. Follow up Visit:  Follow-up Information     Chari Manning, CNM. Schedule an appointment as soon as possible for a visit in 6 week(s).   Specialty: Obstetrics Why: 6 week postpartum visit with Mirena IUD Contact information: [address] [place] [address] [phone]                 Plan:  Benita Walters was discharged to home in good condition. Follow-up appointment as directed.    Signed: Chari Manning CNM

## 2023-08-15 NOTE — Lactation Note (Addendum)
This note was copied from a baby's chart. Lactation Consultation Note  Patient Name: Ariana Mendez [id number] Date: 08/15/2023 Age:38 hours Reason for consult: Follow-up assessment;Primapara;Term   Maternal Data Has patient been taught Hand Expression?: Yes Does the patient have breastfeeding experience prior to this delivery?: No  Feeding Mother's Current Feeding Choice: Breast Milk and Formula Nipple Type: Slow - flow Mom has tight dense breasts, prepumped before attempting latch to soften areola and lenghten nipple, mom turned to left side with baby beside her, baby rooting, will do few sucks on gloved finger but stops and then hunches tongue up in mouth and becomes disorganized in suck, sl gaggy, able to latch to breast without nipple shield, will sucks 2-3 times and stop, mom has extremely sensitive nipples but says she will tolerate it, implemented 20 mm nipple shield already in room, baby latched easily to this and nursed x 5 min well, then stopped and would not start back, would root and stick his tongue out but could not coordinate suck, dad then fed baby 10 cc of mom's own formula(Kendamil)while mom pumped with DEBP in initiation mode, obtained a few drops of colostrum    LATCH Score Latch: Repeated attempts needed to sustain latch, nipple held in mouth throughout feeding, stimulation needed to elicit sucking reflex.  Audible Swallowing: A few with stimulation  Type of Nipple: Everted at rest and after stimulation (flatten when compressed)  Comfort (Breast/Nipple): Filling, red/small blisters or bruises, mild/mod discomfort (dense breasts, sensitive nipples)  Hold (Positioning): Assistance needed to correctly position infant at breast and maintain latch.  LATCH Score: 6   Lactation Tools Discussed/Used Tools: Nipple Shields Nipple shield size: 20 LC name and no written on white board Interventions Interventions: Breast feeding basics reviewed;Assisted with latch;Skin  to skin;Hand express;Pre-pump if needed;Adjust position;Support pillows;Position options;Coconut oil;DEBP;Education For discharge today, plan to offer breast, pump and feed baby EBM and /or formula unitl milk supply increased and baby is latching better, use nipple shield as needed to latch baby  Discharge Discharge Education: Engorgement and breast care;Warning signs for feeding baby;Outpatient recommendation Pump: DEBP;Manual;Personal WIC Program: No Mom given Lactation hours for consult, but mom plans on having  an LC do a home visit plus utilizing a doula for home assistance  Consult Status Consult Status: PRN    Dyann Kief 08/15/2023, 12:29 PM

## 2023-08-15 NOTE — Anesthesia Postprocedure Evaluation (Signed)
Anesthesia Post Note  Patient: Ariana Mendez  Procedure(s) Performed: AN AD HOC LABOR EPIDURAL  Patient location during evaluation: Mother Baby Anesthesia Type: Epidural Level of consciousness: oriented and awake and alert Pain management: pain level controlled Vital Signs Assessment: post-procedure vital signs reviewed and stable Respiratory status: spontaneous breathing, respiratory function stable and patient connected to nasal cannula oxygen Cardiovascular status: blood pressure returned to baseline and stable Postop Assessment: no headache, no backache and no apparent nausea or vomiting Anesthetic complications: no  No notable events documented.   Last Vitals:  Vitals:   08/15/23 0300 08/15/23 0854  BP: 100/67 (!) 98/58  Pulse: 81 76  Resp: 20 18  Temp: 36.9 C 37 C  SpO2: 99% 98%    Last Pain:  Vitals:   08/15/23 0854  TempSrc: Oral  PainSc:                  Stephanie Coup

## 2023-08-16 ENCOUNTER — Inpatient Hospital Stay: Admit: 2023-08-16 | Payer: 59

## 2024-10-06 ENCOUNTER — Ambulatory Visit

## 2024-10-06 DIAGNOSIS — W908XXA Exposure to other nonionizing radiation, initial encounter: Secondary | ICD-10-CM | POA: Diagnosis not present

## 2024-10-06 DIAGNOSIS — L578 Other skin changes due to chronic exposure to nonionizing radiation: Secondary | ICD-10-CM | POA: Diagnosis not present

## 2024-10-06 DIAGNOSIS — L821 Other seborrheic keratosis: Secondary | ICD-10-CM | POA: Diagnosis not present

## 2024-10-06 DIAGNOSIS — D1801 Hemangioma of skin and subcutaneous tissue: Secondary | ICD-10-CM

## 2024-10-06 DIAGNOSIS — L918 Other hypertrophic disorders of the skin: Secondary | ICD-10-CM

## 2024-10-06 DIAGNOSIS — L814 Other melanin hyperpigmentation: Secondary | ICD-10-CM | POA: Diagnosis not present

## 2024-10-06 DIAGNOSIS — Z872 Personal history of diseases of the skin and subcutaneous tissue: Secondary | ICD-10-CM | POA: Diagnosis not present

## 2024-10-06 DIAGNOSIS — Z85828 Personal history of other malignant neoplasm of skin: Secondary | ICD-10-CM

## 2024-10-06 DIAGNOSIS — D229 Melanocytic nevi, unspecified: Secondary | ICD-10-CM

## 2024-10-06 DIAGNOSIS — Z1283 Encounter for screening for malignant neoplasm of skin: Secondary | ICD-10-CM | POA: Diagnosis not present

## 2024-10-06 MED ORDER — TRIAMCINOLONE ACETONIDE 0.1 % EX OINT: TOPICAL_OINTMENT | CUTANEOUS | 5 refills | Status: AC

## 2024-10-06 NOTE — Progress Notes (Signed)
 Subjective   Ariana Mendez is a 38 y.o. female who presents for the following: Total body skin exam for skin cancer screening and mole check. The patient has spots, moles and lesions to be evaluated, some may be new or changing and the patient may have concern these could be cancer.. Patient is new patient  Today patient reports: Patient would like to have skin tags removed at neck, collarbone, axilla. Has not been to the dermatology in the past 6 years. She would like to focus on her left hip mole that gets rubbed against and causes irritation. She states she does wear sunscreen everyday.  Review of Systems:    History of melanoma ROS - denies any fevers, chills, weight loss, night sweats, lymphadenopathy, nausea, vomiting, diarrhea, chest pain, shortness of breath, headaches, changes in vision  .  The following portions of the chart were reviewed this encounter and updated as appropriate: medications, allergies, medical history  Relevant Medical History:  Personal history of melanoma - see medical history for full details  (grandfather, aunts, and uncles)  Objective  (SKPE) Well appearing patient in no apparent distress; mood and affect are within normal limits. Examination was performed of the: Full Skin Examination: scalp, head, eyes, ears, nose, lips, neck, chest, axillae, abdomen, back, buttocks, bilateral upper extremities, bilateral lower extremities, hands, feet, fingers, toes, fingernails, and toenails.   Examination notable for: SKIN EXAM, Angioma(s): Scattered red vascular papule(s)  , Lentigo/lentigines: Scattered pigmented macules that are tan to brown in color and are somewhat non-uniform in shape and concentrated in the sun-exposed areas, Nevus/nevi: Scattered well-demarcated, regular, pigmented macule(s) and/or papule(s)  , Seborrheic Keratosis(es): Stuck-on appearing keratotic papule(s) on the trunk, none  irritated with redness, crusting, edema, and/or partial avulsion,  Actinic Damage/Elastosis: chronic sun damage: dyspigmentation, telangiectasia, and wrinkling  - Acrochordon(s): soft, fleshy, skin-colored to tan pedunculated papules located on the neck and axilla  Examination limited by: Undergarments     Assessment & Plan  (SKAP)   SKIN CANCER SCREENING PERFORMED TODAY.  BENIGN SKIN FINDINGS  - Lentigines  - Seborrheic keratoses  - Hemangiomas   - Nevus/Multiple Benign Nevi  - Reassurance provided regarding the benign appearance of lesions noted on exam today; no treatment is indicated in the absence of symptoms/changes. - Reinforced importance of photoprotective strategies including liberal and frequent sunscreen use of a broad-spectrum SPF 30 or greater, use of protective clothing, and sun avoidance for prevention of cutaneous malignancy and photoaging.  Counseled patient on the importance of regular self-skin monitoring as well as routine clinical skin examinations as scheduled.   ACTINIC DAMAGE - Chronic condition, secondary to cumulative UV/sun exposure - Recommend daily broad spectrum sunscreen SPF 30+ to sun-exposed areas, reapply every 2 hours as needed.  - Staying in the shade or wearing long sleeves, sun glasses (UVA+UVB protection) and wide brim hats (4-inch brim around the entire circumference of the hat) are also recommended for sun protection.  - Call for new or changing lesions.  Acrochordons - Benign, patient reassured of the benign nature of acrochrodons - Explained that more lesions may appear over time - Patient elected to treat these lesions - PAID OUT OF POCKET   Personal history of family history of melanoma - Reviewed medical history for full details  - Reviewed sun protective measures as above - Encouraged full body skin exams     History of PMLE w/ intermittent flares start triamcinolone ointment 0.1% twice daily to affected areas of skin Discussed side effect  of potent topical steroids including atrophy,  dyspigmentation, striae, telangectasia, folliculitis, loss of skin pigment, hair growth, tachyphylaxis, risk of systemic absorption with missuse.  - Pt was instructed about sun protection including: applying sunscreen daily (SPF 30 or greater) with re-application every 2 hours during periods of sun exposure, wearing protective clothing including a wide-brimmed hat and avoiding the hours of peak UV exposure (10 am to 4pm); pt was provided with a written list of appropriate facial and body sunscreens   Procedures, orders, diagnosis for this visit:  ACROCHORDON Chest, neck, bilateral axilla, back x10 - Destruction of lesion - Chest, neck, bilateral axilla, back x10 Complexity: simple   Destruction method: cryotherapy   Informed consent: discussed and consent obtained   Timeout:  patient name, date of birth, surgical site, and procedure verified Lesion destroyed using liquid nitrogen: Yes   Region frozen until ice ball extended beyond lesion: Yes   Cryo cycles: 1 or 2. Outcome: patient tolerated procedure well with no complications   Post-procedure details: wound care instructions given   Additional details:  Prior to procedure, discussed risks of blister formation, small wound, skin dyspigmentation, or rare scar following cryotherapy. Recommend Vaseline ointment to treated areas while healing.    PAID OUT OF POCKET for cosmetic tags   Acrochordon -     Destruction of lesion  Other orders -     Triamcinolone Acetonide; Apply 1 gram twice daily to affected areas of skin. Stop once resolved and restart as needed for flares. Avoid use on face, armpits, groin unless otherwise indicated.  Dispense: 30 g; Refill: 5    Return to clinic: Return in about 1 year (around 10/06/2025) for TBSE, w/ Dr. Raymund.  I, Almetta Nora, RMA, am acting as scribe for Lauraine JAYSON Raymund, MD .   Documentation: I have reviewed the above documentation for accuracy and completeness, and I agree with the  above.  Lauraine JAYSON Raymund, MD

## 2024-10-06 NOTE — Patient Instructions (Signed)

## 2025-10-02 ENCOUNTER — Ambulatory Visit

## 2025-10-05 ENCOUNTER — Ambulatory Visit
